# Patient Record
Sex: Female | Born: 2000 | Race: Black or African American | Hispanic: No | Marital: Single | Smoking: Never smoker
Health system: Southern US, Community
[De-identification: ages and names within clinical notes are randomized; demographics above are authoritative.]

## PROBLEM LIST (undated history)

## (undated) DIAGNOSIS — G43909 Migraine, unspecified, not intractable, without status migrainosus: Secondary | ICD-10-CM

## (undated) DIAGNOSIS — J302 Other seasonal allergic rhinitis: Secondary | ICD-10-CM

## (undated) HISTORY — PX: WISDOM TOOTH EXTRACTION: SHX21

---
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
## 2019-04-07 ENCOUNTER — Ambulatory Visit: Payer: Managed Care, Other (non HMO) | Admitting: Physical Therapy

## 2019-04-15 ENCOUNTER — Ambulatory Visit: Payer: Managed Care, Other (non HMO) | Attending: Gastroenterology | Admitting: Physical Therapy

## 2019-06-07 ENCOUNTER — Encounter: Payer: Self-pay | Admitting: Emergency Medicine

## 2019-06-07 ENCOUNTER — Emergency Department: Payer: Managed Care, Other (non HMO)

## 2019-06-07 ENCOUNTER — Other Ambulatory Visit: Payer: Self-pay

## 2019-06-07 ENCOUNTER — Emergency Department
Admission: EM | Admit: 2019-06-07 | Discharge: 2019-06-07 | Disposition: A | Discharge status: Home / Self Care | Payer: Managed Care, Other (non HMO) | Attending: Emergency Medicine | Admitting: Emergency Medicine

## 2019-06-07 DIAGNOSIS — M79671 Pain in right foot: Secondary | ICD-10-CM | POA: Diagnosis not present

## 2019-06-07 DIAGNOSIS — Z79899 Other long term (current) drug therapy: Secondary | ICD-10-CM | POA: Insufficient documentation

## 2019-06-07 MED ORDER — NAPROXEN 500 MG PO TABS: 500.0000 mg | ORAL_TABLET | Freq: Two times a day (BID) | ORAL | 0 refills | Status: DC

## 2019-06-07 NOTE — ED Notes (Signed)
Pt states she stepped on a piece of glass Friday night and didn't realize it til Saturday morning and states she got a large piece out but thinks there is still some in there.
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
## 2019-06-07 NOTE — Discharge Instructions (Signed)
Please follow up with primary care or the podiatrist for symptoms that are not improving over the next few days.  Return to the ER for symptoms that change or worsen if unable to schedule an appointment.

## 2019-06-07 NOTE — ED Provider Notes (Signed)
[facility] Emergency Department Provider Note  ____________________________________________  Time seen: Approximately 12:47 PM  I have reviewed the triage vital signs and the nursing notes.   HISTORY  Chief Complaint Foreign Body   HPI Meagan Daniel is a 18 y.o. female who presents to the emergency department for treatment and evaluation of right foot pain after stepping on glass Friday night. Saturday morning, she removed a large piece but believes there is still some in there. She rinsed with peroxide, but otherwise no alleviating measures prior to arrival.  History reviewed. No pertinent past medical history.  Patient Active Problem List   Diagnosis Date Noted  . Preseptal cellulitis of right lower eyelid 02/28/2016    History reviewed. No pertinent surgical history.  Prior to Admission medications   Medication Sig Start Date End Date Taking? Authorizing Provider  amoxicillin-clavulanate (AUGMENTIN) 875-125 MG tablet Take 1 tablet by mouth 2 (two) times daily. Patient not taking: Reported on 06/07/2019 02/29/16   Adrian SaranMody, Sital, MD  cetirizine (ZYRTEC) 10 MG tablet Take 10 mg by mouth daily.    [provider]  ibuprofen (ADVIL,MOTRIN) 600 MG tablet Take 1 tablet (600 mg total) by mouth every 6 (six) hours as needed. Patient not taking: Reported on 06/07/2019 08/26/18   Joy, Shawn C, PA-C  lidocaine (LIDODERM) 5 % Place 1 patch onto the skin daily. Remove & Discard patch within 12 hours or as directed by MD Patient not taking: Reported on 06/07/2019 08/26/18   Joy, Hillard DankerShawn C, PA-C  methocarbamol (ROBAXIN) 500 MG tablet Take 1 tablet (500 mg total) by mouth 2 (two) times daily. Patient not taking: Reported on 06/07/2019 08/26/18   Harolyn RutherfordJoy, Shawn C, PA-C  naproxen (NAPROSYN) 500 MG tablet Take 1 tablet (500 mg total) by mouth 2 (two) times daily with a meal. 06/07/19   Ilaisaane Marts B, FNP    Allergies Lactose intolerance (gi) and Diphenhydramine hcl  No  family history on file.  Social History Social History   Tobacco Use  . Smoking status: Never Smoker  . Smokeless tobacco: Never Used  Substance Use Topics  . Alcohol use: No    Alcohol/week: 0.0 standard drinks  . Drug use: No    Review of Systems  Constitutional: Negative for fever. Respiratory: Negative for cough or shortness of breath.  Musculoskeletal: Negative for myalgias Skin: Questionable for foreign body. Neurological: Negative for numbness or paresthesias. ____________________________________________   PHYSICAL EXAM:  VITAL SIGNS: ED Triage Vitals  Enc Vitals Group     BP 06/07/19 1134 117/78     Pulse Rate 06/07/19 1134 88     Resp 06/07/19 1134 16     Temp 06/07/19 1134 98.4 F (36.9 C)     Temp Source 06/07/19 1134 Oral     SpO2 06/07/19 1134 100 %     Weight 06/07/19 1132 248 lb (112.5 kg)     Height 06/07/19 1132 5\' 11"  (1.803 m)     Head Circumference --      Peak Flow --      Pain Score 06/07/19 1132 7     Pain Loc --      Pain Edu? --      Excl. in GC? --      Constitutional: Well appearing. Eyes: Conjunctivae are clear without discharge or drainage. Nose: No rhinorrhea noted. Mouth/Throat: Airway is patent.  Neck: No stridor. Unrestricted range of motion observed. Cardiovascular: Capillary refill is <3 seconds.  Respiratory: Respirations are even and unlabored.. Musculoskeletal:  Unrestricted range of motion observed. Neurologic: Awake, alert, and oriented x 4.  Skin: No open wounds over the right foot on exam.  ____________________________________________   LABS (all labs ordered are listed, but only abnormal results are displayed)  Labs Reviewed - No data to display ____________________________________________  EKG  Not indicated. ____________________________________________  RADIOLOGY  Image of the right foot is negative for acute abnormality.   ____________________________________________   PROCEDURES  Procedures ____________________________________________   INITIAL IMPRESSION / ASSESSMENT AND PLAN / ED COURSE  Meagan Daniel is a 19 y.o. female presenting to the ER for evaluation of right foot pain after stepping on Glass 2 days ago.   Ultrasound utilized by me to attempt to identify foreign body after the x-ray was negative. No obvious foreign body identified on my exam.   She will be given crutches and ACE applied prior to discharge. She will be given a prescription for naprosyn prn pain. She is to follow up with podiatry or primary care if not improving over the next few days. She is to return to the ER for symptoms that change or worsen if unable to schedule an appointment. Medications - No data to display   Pertinent labs & imaging results that were available during my care of the patient were reviewed by me and considered in my medical decision making (see chart for details).  ____________________________________________   FINAL CLINICAL IMPRESSION(S) / ED DIAGNOSES  Final diagnoses:  Foot pain, right    ED Discharge Orders         Ordered    naproxen (NAPROSYN) 500 MG tablet  2 times daily with meals     06/07/19 1328           Note:  This document was prepared using Dragon voice recognition software and may include unintentional dictation errors.   Victorino Dike, FNP 06/07/19 1333    Nena Polio, MD 06/07/19 2139

## 2019-06-29 ENCOUNTER — Ambulatory Visit: Payer: Managed Care, Other (non HMO) | Attending: Gastroenterology | Admitting: Physical Therapy

## 2019-08-09 ENCOUNTER — Encounter (HOSPITAL_COMMUNITY): Payer: Self-pay | Admitting: Emergency Medicine

## 2019-08-09 ENCOUNTER — Emergency Department (HOSPITAL_COMMUNITY)
Admission: EM | Admit: 2019-08-09 | Discharge: 2019-08-10 | Disposition: A | Discharge status: Home / Self Care | Payer: Managed Care, Other (non HMO) | Attending: Emergency Medicine | Admitting: Emergency Medicine

## 2019-08-09 ENCOUNTER — Other Ambulatory Visit: Payer: Self-pay

## 2019-08-09 DIAGNOSIS — R519 Headache, unspecified: Secondary | ICD-10-CM | POA: Insufficient documentation

## 2019-08-09 DIAGNOSIS — Z79899 Other long term (current) drug therapy: Secondary | ICD-10-CM | POA: Diagnosis not present

## 2019-08-09 HISTORY — DX: Migraine, unspecified, not intractable, without status migrainosus: G43.909

## 2019-08-09 NOTE — ED Triage Notes (Signed)
C/o headache x 3 days.  Seen at an [facility] yesterday and given Tylenol, Reglan, and Toradol.  Headache resolved and then came back today.  Fever 100 at home.

## 2019-08-10 ENCOUNTER — Emergency Department (HOSPITAL_COMMUNITY): Payer: Managed Care, Other (non HMO)

## 2019-08-10 DIAGNOSIS — R519 Headache, unspecified: Secondary | ICD-10-CM | POA: Diagnosis not present

## 2019-08-10 MED ORDER — KETOROLAC TROMETHAMINE 30 MG/ML IJ SOLN
30.0000 mg | Freq: Once | INTRAMUSCULAR | Status: AC
  Administered 2019-08-10: 30 mg via INTRAVENOUS
  Filled 2019-08-10: qty 1

## 2019-08-10 MED ORDER — METOCLOPRAMIDE HCL 5 MG/ML IJ SOLN
10.0000 mg | Freq: Once | INTRAMUSCULAR | Status: AC
  Administered 2019-08-10: 10 mg via INTRAVENOUS
  Filled 2019-08-10: qty 2

## 2019-08-10 MED ORDER — DIPHENHYDRAMINE HCL 50 MG/ML IJ SOLN
25.0000 mg | Freq: Once | INTRAMUSCULAR | Status: DC
  Filled 2019-08-10: qty 1

## 2019-08-10 MED ORDER — SODIUM CHLORIDE 0.9 % IV BOLUS
1000.0000 mL | Freq: Once | INTRAVENOUS | Status: AC
  Administered 2019-08-10: 02:00:00 1000 mL via INTRAVENOUS

## 2019-08-10 MED ORDER — DEXAMETHASONE SODIUM PHOSPHATE 10 MG/ML IJ SOLN
10.0000 mg | Freq: Once | INTRAMUSCULAR | Status: AC
  Administered 2019-08-10: 02:00:00 10 mg via INTRAVENOUS
  Filled 2019-08-10: qty 1

## 2019-08-10 NOTE — ED Notes (Signed)
Patient transported to CT 

## 2019-08-10 NOTE — Discharge Instructions (Addendum)
Continue medications as previously prescribed.  Follow-up with your primary doctor if symptoms or not improving in the next few days. 

## 2019-08-10 NOTE — ED Provider Notes (Signed)
[facility] Provider Note   CSN: [id number] Arrival date & time: 08/09/19  1930     History   Chief Complaint Chief Complaint  Patient presents with  . Headache    HPI Meagan Daniel is a 18 y.o. female.     Patient is an 18 year old female with history of migraines presenting with complaints of headache.  This has been present for the past several days.  She was seen at urgent care and given medications with some relief yesterday, however her headache has returned.  She describes a pressure to the front of her head with no associated visual disturbances, numbness, tingling, or vomiting.  She denies any injury or trauma.  The history is provided by the patient.  Headache Pain location:  Frontal Radiates to:  Does not radiate Duration:  3 days Timing:  Constant Progression:  Worsening Chronicity:  Recurrent Relieved by:  Nothing Worsened by:  Nothing Ineffective treatments:  Prescription medications   Past Medical History:  Diagnosis Date  . Migraine     Patient Active Problem List   Diagnosis Date Noted  . Preseptal cellulitis of right lower eyelid 02/28/2016    History reviewed. No pertinent surgical history.   OB History    Gravida  0   Para  0   Term  0   Preterm  0   AB  0   Living  0     SAB  0   TAB  0   Ectopic  0   Multiple  0   Live Births  0            Home Medications    Prior to Admission medications   Medication Sig Start Date End Date Taking? Authorizing Provider  nortriptyline (PAMELOR) 25 MG capsule Take 25 mg by mouth at bedtime.   Yes [provider]  amoxicillin-clavulanate (AUGMENTIN) 875-125 MG tablet Take 1 tablet by mouth 2 (two) times daily. Patient not taking: Reported on 06/07/2019 02/29/16   Bettey Costa, MD  ibuprofen (ADVIL,MOTRIN) 600 MG tablet Take 1 tablet (600 mg total) by mouth every 6 (six) hours as needed. Patient not taking: Reported on 06/07/2019 08/26/18    Joy, Shawn C, PA-C  lidocaine (LIDODERM) 5 % Place 1 patch onto the skin daily. Remove & Discard patch within 12 hours or as directed by MD Patient not taking: Reported on 06/07/2019 08/26/18   Joy, Helane Gunther, PA-C  methocarbamol (ROBAXIN) 500 MG tablet Take 1 tablet (500 mg total) by mouth 2 (two) times daily. Patient not taking: Reported on 06/07/2019 08/26/18   Arlean Hopping C, PA-C  naproxen (NAPROSYN) 500 MG tablet Take 1 tablet (500 mg total) by mouth 2 (two) times daily with a meal. Patient not taking: Reported on 08/10/2019 06/07/19   Victorino Dike, FNP    Family History No family history on file.  Social History Social History   Tobacco Use  . Smoking status: Never Smoker  . Smokeless tobacco: Never Used  Substance Use Topics  . Alcohol use: No    Alcohol/week: 0.0 standard drinks  . Drug use: No     Allergies   Lactose intolerance (gi) and Diphenhydramine hcl   Review of Systems Review of Systems  Neurological: Positive for headaches.  All other systems reviewed and are negative.    Physical Exam Updated Vital Signs BP 110/80 (BP Location: Left Arm)   Pulse 82   Temp 98.5 F (36.9 C) (Oral)  Resp 19   LMP 07/26/2019   SpO2 100%   Physical Exam Vitals signs and nursing note reviewed.  Constitutional:      General: She is not in acute distress.    Appearance: She is well-developed. She is not diaphoretic.  HENT:     Head: Normocephalic and atraumatic.  Eyes:     General: No visual field deficit. Neck:     Musculoskeletal: Normal range of motion and neck supple.  Cardiovascular:     Rate and Rhythm: Normal rate and regular rhythm.     Heart sounds: No murmur. No friction rub. No gallop.   Pulmonary:     Effort: Pulmonary effort is normal. No respiratory distress.     Breath sounds: Normal breath sounds. No wheezing.  Abdominal:     General: Bowel sounds are normal. There is no distension.     Palpations: Abdomen is soft.     Tenderness: There is no  abdominal tenderness.  Musculoskeletal: Normal range of motion.  Skin:    General: Skin is warm and dry.  Neurological:     Mental Status: She is alert and oriented to person, place, and time.     Cranial Nerves: No cranial nerve deficit, dysarthria or facial asymmetry.      ED Treatments / Results  Labs (all labs ordered are listed, but only abnormal results are displayed) Labs Reviewed - No data to display  EKG None  Radiology Ct Head Wo Contrast  Result Date: 08/10/2019 CLINICAL DATA:  Headache for 3 days EXAM: CT HEAD WITHOUT CONTRAST TECHNIQUE: Contiguous axial images were obtained from the base of the skull through the vertex without intravenous contrast. COMPARISON:  None. FINDINGS: Brain: No evidence of acute infarction, hemorrhage, hydrocephalus, extra-axial collection or mass lesion/mass effect. Vascular: No hyperdense vessel or unexpected calcification. Skull: Normal. Negative for fracture or focal lesion. Sinuses/Orbits: Mucous retention cysts in the maxillary sinuses Other: None IMPRESSION: Negative non contrasted CT appearance of the brain Electronically Signed   By: Jasmine Pang M.D.   On: 08/10/2019 01:56    Procedures Procedures (including critical care time)  Medications Ordered in ED Medications  diphenhydrAMINE (BENADRYL) injection 25 mg (25 mg Intravenous Not Given 08/10/19 0217)  sodium chloride 0.9 % bolus 1,000 mL (0 mLs Intravenous Stopped 08/10/19 0302)  ketorolac (TORADOL) 30 MG/ML injection 30 mg (30 mg Intravenous Given 08/10/19 0212)  metoCLOPramide (REGLAN) injection 10 mg (10 mg Intravenous Given 08/10/19 0212)  dexamethasone (DECADRON) injection 10 mg (10 mg Intravenous Given 08/10/19 0212)     Initial Impression / Assessment and Plan / ED Course  I have reviewed the triage vital signs and the nursing notes.  Pertinent labs & imaging results that were available during my care of the patient were reviewed by me and considered in my medical decision  making (see chart for details).  Patient's headache has resolved after migraine cocktail.  She is neurologically intact and head CT is negative.  Patient will be discharged with follow-up with PCP.  Final Clinical Impressions(s) / ED Diagnoses   Final diagnoses:  None    ED Discharge Orders    None       Geoffery Lyons, MD 08/10/19 [phone]

## 2020-05-16 ENCOUNTER — Encounter: Payer: Self-pay | Admitting: Emergency Medicine

## 2020-05-16 ENCOUNTER — Other Ambulatory Visit: Payer: Self-pay

## 2020-05-16 ENCOUNTER — Ambulatory Visit
Admission: EM | Admit: 2020-05-16 | Discharge: 2020-05-16 | Disposition: A | Discharge status: Home / Self Care | Payer: Medicaid Other | Attending: Emergency Medicine | Admitting: Emergency Medicine

## 2020-05-16 DIAGNOSIS — K219 Gastro-esophageal reflux disease without esophagitis: Secondary | ICD-10-CM | POA: Diagnosis not present

## 2020-05-16 MED ORDER — PANTOPRAZOLE SODIUM 40 MG PO TBEC: 40.0000 mg | DELAYED_RELEASE_TABLET | Freq: Every day | ORAL | 0 refills | Status: DC

## 2020-05-16 NOTE — ED Provider Notes (Signed)
[facility]    CSN: [id number] Arrival date & time: 05/16/20  1934      History   Chief Complaint Chief Complaint  Patient presents with  . Dental Pain    HPI Meagan Daniel is a 19 y.o. female with history of migraines, GERD presenting for GERD flare.  States that she has had increased belching, reflux over the last 3 days.  Not currently taking any medications despite being recommended by her GI doctor to do so.  States her grandmother told her to "drink a Pepsi really fast "to burp which she thought would alleviate her retrosternal chest pain.  Has tried Alka-Seltzer without relief.  No nausea, emesis.  Woke up with her tongue feeling sore.  Denies dental pain, difficulty breathing or swallowing.   Past Medical History:  Diagnosis Date  . Migraine     Patient Active Problem List   Diagnosis Date Noted  . Preseptal cellulitis of right lower eyelid 02/28/2016    History reviewed. No pertinent surgical history.  OB History    Gravida  0   Para  0   Term  0   Preterm  0   AB  0   Living  0     SAB  0   TAB  0   Ectopic  0   Multiple  0   Live Births  0            Home Medications    Prior to Admission medications   Medication Sig Start Date End Date Taking? Authorizing Provider  pantoprazole (PROTONIX) 40 MG tablet Take 1 tablet (40 mg total) by mouth daily. 05/16/20 06/15/20  Hall-Potvin, [place], PA-C  nortriptyline (PAMELOR) 25 MG capsule Take 25 mg by mouth at bedtime.  05/16/20  [provider]    Family History Family History  Problem Relation Age of Onset  . Healthy Mother   . Healthy Father     Social History Social History   Tobacco Use  . Smoking status: Never Smoker  . Smokeless tobacco: Never Used  Substance Use Topics  . Alcohol use: No    Alcohol/week: 0.0 standard drinks  . Drug use: No     Allergies   Lactose intolerance (gi) and Diphenhydramine hcl   Review of Systems As per  HPI   Physical Exam Triage Vital Signs ED Triage Vitals  Enc Vitals Group     BP 05/16/20 1943 114/78     Pulse Rate 05/16/20 1943 80     Resp 05/16/20 1943 18     Temp 05/16/20 1943 97.8 F (36.6 C)     Temp Source 05/16/20 1943 Temporal     SpO2 05/16/20 1943 98 %     Weight --      Height --      Head Circumference --      Peak Flow --      Pain Score 05/16/20 1942 9     Pain Loc --      Pain Edu? --      Excl. in GC? --    No data found.  Updated Vital Signs BP 114/78 (BP Location: Left Arm)   Pulse 80   Temp 97.8 F (36.6 C) (Temporal)   Resp 18   LMP 05/02/2020   SpO2 98%   Visual Acuity Right Eye Distance:   Left Eye Distance:   Bilateral Distance:    Right Eye Near:   Left Eye Near:    Bilateral  Near:     Physical Exam Constitutional:      General: She is not in acute distress. HENT:     Head: Normocephalic and atraumatic.     Mouth/Throat:     Mouth: Mucous membranes are moist.     Pharynx: Oropharynx is clear. No oropharyngeal exudate or posterior oropharyngeal erythema.  Eyes:     General: No scleral icterus.    Pupils: Pupils are equal, round, and reactive to light.  Cardiovascular:     Rate and Rhythm: Normal rate.  Pulmonary:     Effort: Pulmonary effort is normal. No respiratory distress.     Breath sounds: No wheezing.  Abdominal:     General: Bowel sounds are normal.     Palpations: Abdomen is soft.     Tenderness: There is no abdominal tenderness.  Skin:    Coloration: Skin is not jaundiced or pale.  Neurological:     Mental Status: She is alert and oriented to person, place, and time.      UC Treatments / Results  Labs (all labs ordered are listed, but only abnormal results are displayed) Labs Reviewed - No data to display  EKG   Radiology No results found.  Procedures Procedures (including critical care time)  Medications Ordered in UC Medications - No data to display  Initial Impression / Assessment and Plan  / UC Course  I have reviewed the triage vital signs and the nursing notes.  Pertinent labs & imaging results that were available during my care of the patient were reviewed by me and considered in my medical decision making (see chart for details).     Patient febrile, nontoxic in office today.  Exam benign currently.  H&P consistent with GERD: We will start Protonix, follow-up with GI doctor.  Return precautions discussed, patient verbalized understanding and is agreeable to plan. Final Clinical Impressions(s) / UC Diagnoses   Final diagnoses:  Gastroesophageal reflux disease, unspecified whether esophagitis present     Discharge Instructions     Take Protonix once daily. Please take time to review food suggestion packet that is attached. Important to follow-up with your PCP and/or gastroenterology for further evaluation/management of your reflux. Go to ER for worsening chest pain redevelop palpitations, fever, lightheadedness, weakness, shortness of breath.    ED Prescriptions    Medication Sig Dispense Auth. Provider   pantoprazole (PROTONIX) 40 MG tablet Take 1 tablet (40 mg total) by mouth daily. 30 tablet Hall-Potvin, [place], PA-C     PDMP not reviewed this encounter.   Odette Fraction [place], [place] 05/16/20 1952

## 2020-05-16 NOTE — ED Notes (Signed)
Patient able to ambulate independently  

## 2020-05-16 NOTE — Discharge Instructions (Signed)
Take Protonix once daily. Please take time to review food suggestion packet that is attached. Important to follow-up with your PCP and/or gastroenterology for further evaluation/management of your reflux. Go to ER for worsening chest pain redevelop palpitations, fever, lightheadedness, weakness, shortness of breath.

## 2020-05-16 NOTE — ED Triage Notes (Signed)
atient presents to [facility] for assessment of GERD x 3 days, trying "drinking a pepsi really fast", alka seltzer without relief.  States she woke up today and the middle and back of her tongue was sore.

## 2020-05-18 ENCOUNTER — Ambulatory Visit
Admission: EM | Admit: 2020-05-18 | Discharge: 2020-05-18 | Disposition: A | Discharge status: Home / Self Care | Payer: Medicaid Other | Attending: Physician Assistant | Admitting: Physician Assistant

## 2020-05-18 DIAGNOSIS — K146 Glossodynia: Secondary | ICD-10-CM

## 2020-05-18 MED ORDER — LIDOCAINE VISCOUS HCL 2 % MT SOLN: 15.0000 mL | OROMUCOSAL | 0 refills | Status: DC | PRN

## 2020-05-18 MED ORDER — CHLORHEXIDINE GLUCONATE 0.12 % MT SOLN: 15.0000 mL | Freq: Two times a day (BID) | OROMUCOSAL | 0 refills | Status: DC

## 2020-05-18 NOTE — Discharge Instructions (Signed)
Continue acid reflux medicine. Start peridex as directed. Lidocaine for pain. Follow up with ENT/dentist if symptoms not improving.

## 2020-05-18 NOTE — ED Triage Notes (Signed)
Pt states seen here on Monday for soreness to her tongue, states getting worse. States now sensitive and painful.

## 2020-05-18 NOTE — ED Provider Notes (Signed)
[facility]    CSN: [id number] Arrival date & time: 05/18/20  1321      History   Chief Complaint Chief Complaint  Patient presents with  . tongue pain    HPI Meagan Daniel is a 19 y.o. female.   19 year old female comes in for tongue pain. Denies injury/trauma. Was here 2 days ago with same symptoms and GERD. Has not started medicines prescribed because she takes other PPIs already. States tongue is sore and sensitive. Denies oral swelling, fever.      Past Medical History:  Diagnosis Date  . Migraine     Patient Active Problem List   Diagnosis Date Noted  . Preseptal cellulitis of right lower eyelid 02/28/2016    History reviewed. No pertinent surgical history.  OB History    Gravida  0   Para  0   Term  0   Preterm  0   AB  0   Living  0     SAB  0   TAB  0   Ectopic  0   Multiple  0   Live Births  0            Home Medications    Prior to Admission medications   Medication Sig Start Date End Date Taking? Authorizing Provider  chlorhexidine (PERIDEX) 0.12 % solution Use as directed 15 mLs in the mouth or throat 2 (two) times daily. 05/18/20   Cathie Hoops, Goerge Mohr V, PA-C  lidocaine (XYLOCAINE) 2 % solution Use as directed 15 mLs in the mouth or throat as needed for mouth pain. 05/18/20   Cathie Hoops, Valjean Ruppel V, PA-C  pantoprazole (PROTONIX) 40 MG tablet Take 1 tablet (40 mg total) by mouth daily. 05/16/20 06/15/20  Hall-Potvin, [place], PA-C  nortriptyline (PAMELOR) 25 MG capsule Take 25 mg by mouth at bedtime.  05/16/20  [provider]    Family History Family History  Problem Relation Age of Onset  . Healthy Mother   . Healthy Father     Social History Social History   Tobacco Use  . Smoking status: Never Smoker  . Smokeless tobacco: Never Used  Substance Use Topics  . Alcohol use: No    Alcohol/week: 0.0 standard drinks  . Drug use: No     Allergies   Lactose intolerance (gi) and Diphenhydramine hcl   Review of  Systems Review of Systems  Reason unable to perform ROS: See HPI as above.     Physical Exam Triage Vital Signs ED Triage Vitals [05/18/20 1334]  Enc Vitals Group     BP 125/82     Pulse Rate 78     Resp 18     Temp 98.4 F (36.9 C)     Temp Source Oral     SpO2 98 %     Weight      Height      Head Circumference      Peak Flow      Pain Score 9     Pain Loc      Pain Edu?      Excl. in GC?    No data found.  Updated Vital Signs BP 125/82 (BP Location: Left Arm)   Pulse 78   Temp 98.4 F (36.9 C) (Oral)   Resp 18   LMP 04/27/2020   SpO2 98%   Physical Exam Constitutional:      General: She is not in acute distress.    Appearance: Normal appearance. She  is well-developed. She is not toxic-appearing or diaphoretic.  HENT:     Head: Normocephalic and atraumatic.     Mouth/Throat:     Mouth: Mucous membranes are moist.     Pharynx: Oropharynx is clear. Uvula midline.     Comments: Thick yellow coating to the tongue that is painful. Plaques to the teeth/gumline. No gum swelling, erythema.  Eyes:     Conjunctiva/sclera: Conjunctivae normal.     Pupils: Pupils are equal, round, and reactive to light.  Pulmonary:     Effort: Pulmonary effort is normal. No respiratory distress.     Comments: Speaking in full sentences without difficulty Musculoskeletal:     Cervical back: Normal range of motion and neck supple.  Skin:    General: Skin is warm and dry.  Neurological:     Mental Status: She is alert and oriented to person, place, and time.    UC Treatments / Results  Labs (all labs ordered are listed, but only abnormal results are displayed) Labs Reviewed - No data to display  EKG   Radiology No results found.  Procedures Procedures (including critical care time)  Medications Ordered in UC Medications - No data to display  Initial Impression / Assessment and Plan / UC Course  I have reviewed the triage vital signs and the nursing notes.  Pertinent  labs & imaging results that were available during my care of the patient were reviewed by me and considered in my medical decision making (see chart for details).    Will provide Peridex as directed.  Lidocaine solution for symptomatic management.  Continue medication for GERD.  Follow-up with ENT/dentist if symptoms not improving.  Final Clinical Impressions(s) / UC Diagnoses   Final diagnoses:  Tongue pain   ED Prescriptions    Medication Sig Dispense Auth. Provider   lidocaine (XYLOCAINE) 2 % solution Use as directed 15 mLs in the mouth or throat as needed for mouth pain. 100 mL Liston Thum V, PA-C   chlorhexidine (PERIDEX) 0.12 % solution Use as directed 15 mLs in the mouth or throat 2 (two) times daily. 120 mL Belinda Fisher, PA-C     PDMP not reviewed this encounter.   Belinda Fisher, PA-C 05/18/20 1705

## 2020-06-13 ENCOUNTER — Ambulatory Visit
Admission: EM | Admit: 2020-06-13 | Discharge: 2020-06-13 | Disposition: A | Discharge status: Home / Self Care | Payer: Medicaid Other

## 2020-06-13 ENCOUNTER — Other Ambulatory Visit: Payer: Self-pay

## 2020-06-13 DIAGNOSIS — S61213A Laceration without foreign body of left middle finger without damage to nail, initial encounter: Secondary | ICD-10-CM | POA: Diagnosis not present

## 2020-06-13 NOTE — ED Provider Notes (Signed)
[facility]    CSN: [id number] Arrival date & time: 06/13/20  1837      History   Chief Complaint Chief Complaint  Patient presents with  . Laceration    HPI Meagan Daniel is a 19 y.o. female.  For left middle finger laceration.  States she dropped a group of glass around 10:30 PM.  Tetanus up-to-date.  Bleeding controlled PTA with direct pressure.  Denies anticoagulant use.  No numbness or deformity.  No foreign body.    Past Medical History:  Diagnosis Date  . Migraine     Patient Active Problem List   Diagnosis Date Noted  . Preseptal cellulitis of right lower eyelid 02/28/2016    History reviewed. No pertinent surgical history.  OB History    Gravida  0   Para  0   Term  0   Preterm  0   AB  0   Living  0     SAB  0   TAB  0   Ectopic  0   Multiple  0   Live Births  0            Home Medications    Prior to Admission medications   Medication Sig Start Date End Date Taking? Authorizing Provider  cetirizine (ZYRTEC) 10 MG tablet Take 10 mg by mouth daily.   Yes [provider]  chlorhexidine (PERIDEX) 0.12 % solution Use as directed 15 mLs in the mouth or throat 2 (two) times daily. 05/18/20   Cathie Hoops, Amy V, PA-C  nortriptyline (PAMELOR) 25 MG capsule Take 25 mg by mouth at bedtime.  05/16/20  [provider]    Family History Family History  Problem Relation Age of Onset  . Healthy Mother   . Healthy Father     Social History Social History   Tobacco Use  . Smoking status: Never Smoker  . Smokeless tobacco: Never Used  Substance Use Topics  . Alcohol use: No    Alcohol/week: 0.0 standard drinks  . Drug use: No     Allergies   Lactose intolerance (gi) and Diphenhydramine hcl   Review of Systems As per HPI   Physical Exam Triage Vital Signs ED Triage Vitals  Enc Vitals Group     BP      Pulse      Resp      Temp      Temp src      SpO2      Weight      Height      Head  Circumference      Peak Flow      Pain Score      Pain Loc      Pain Edu?      Excl. in GC?    No data found.  Updated Vital Signs BP 123/79 (BP Location: Left Arm)   Pulse 82   Temp 98.2 F (36.8 C) (Oral)   Resp 18   LMP 05/30/2020   SpO2 98%   Visual Acuity Right Eye Distance:   Left Eye Distance:   Bilateral Distance:    Right Eye Near:   Left Eye Near:    Bilateral Near:     Physical Exam Constitutional:      General: She is not in acute distress. HENT:     Head: Normocephalic and atraumatic.  Eyes:     General: No scleral icterus.    Pupils: Pupils are equal, round, and reactive to  light.  Cardiovascular:     Rate and Rhythm: Normal rate.  Pulmonary:     Effort: Pulmonary effort is normal.  Musculoskeletal:        General: No swelling or tenderness. Normal range of motion.  Skin:    Coloration: Skin is not jaundiced or pale.     Comments: 1 cm superficial laceration noted to medial aspect of left third digit.  No FB.  Neurological:     Mental Status: She is alert and oriented to person, place, and time.      UC Treatments / Results  Labs (all labs ordered are listed, but only abnormal results are displayed) Labs Reviewed - No data to display  EKG   Radiology No results found.  Procedures Laceration Repair  Date/Time: 06/13/2020 7:31 PM Performed by: Shea Evans, PA-C Authorized by: Shea Evans, PA-C   Consent:    Consent obtained:  Verbal   Consent given by:  Patient   Risks discussed:  Infection, need for additional repair, pain, poor cosmetic result and poor wound healing   Alternatives discussed:  No treatment and delayed treatment Universal protocol:    Patient identity confirmed:  Verbally with patient Anesthesia (see MAR for exact dosages):    Anesthesia method:  None Laceration details:    Location:  Finger   Finger location:  L long finger   Length (cm):  1   Depth (mm):  1 Repair type:    Repair type:   Simple Pre-procedure details:    Preparation:  Patient was prepped and draped in usual sterile fashion Exploration:    Hemostasis achieved with:  Direct pressure   Wound exploration: wound explored through full range of motion     Contaminated: no   Treatment:    Area cleansed with:  Soap and water   Amount of cleaning:  Standard   Irrigation solution:  Sterile saline   Irrigation method:  Pressure wash Skin repair:    Repair method:  Steri-Strips   Number of Steri-Strips:  1 Approximation:    Approximation:  Close Post-procedure details:    Dressing:  Adhesive bandage   Patient tolerance of procedure:  Tolerated well, no immediate complications   (including critical care time)  Medications Ordered in UC Medications - No data to display  Initial Impression / Assessment and Plan / UC Course  I have reviewed the triage vital signs and the nursing notes.  Pertinent labs & imaging results that were available during my care of the patient were reviewed by me and considered in my medical decision making (see chart for details).     Steri-Strip applied at patient's request: Tolerated this well.  Return precautions discussed, pt verbalized understanding and is agreeable to plan. Final Clinical Impressions(s) / UC Diagnoses   Final diagnoses:  Laceration of left middle finger without foreign body without damage to nail, initial encounter     Discharge Instructions     Keep skin clean - may use gentle soaps without perfumes/dyes. Avoid hot water (showers, baths) as this can further dry out and irritate skin. Pat skin dry as rubbing can irritate and tear skin.    ED Prescriptions    None     PDMP not reviewed this encounter.   Odette Fraction [place], [place] 06/13/20 1932

## 2020-06-13 NOTE — Discharge Instructions (Addendum)
Keep skin clean - may use gentle soaps without perfumes/dyes. Avoid hot water (showers, baths) as this can further dry out and irritate skin. Pat skin dry as rubbing can irritate and tear skin. 

## 2020-06-13 NOTE — ED Triage Notes (Signed)
Pt states dropped and broke a glass last night around 1030pm and cut herself on lt middle finger and lt ankle. Bleeding controlled, bandage applied.

## 2021-04-27 ENCOUNTER — Other Ambulatory Visit: Payer: Medicaid Other

## 2021-05-31 IMAGING — CT CT HEAD W/O CM
4 series · 16 of 47 positions shown, 18 images · non-contrast
Comparison: None.

CLINICAL DATA: Headache for 3 days

EXAM:
CT HEAD WITHOUT CONTRAST
TECHNIQUE: Contiguous axial images were obtained from the base of the skull
through the vertex without intravenous contrast.

[Series 3: head without · axial · non-contrast · 0.44mm/px · z∈[-119,+1]mm · 7 of 33 slices shown, 9 images]
[im 5/33  brain]
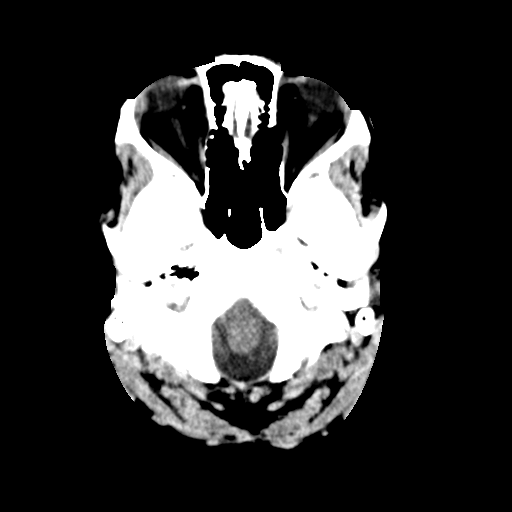
[im 5/33  bone]
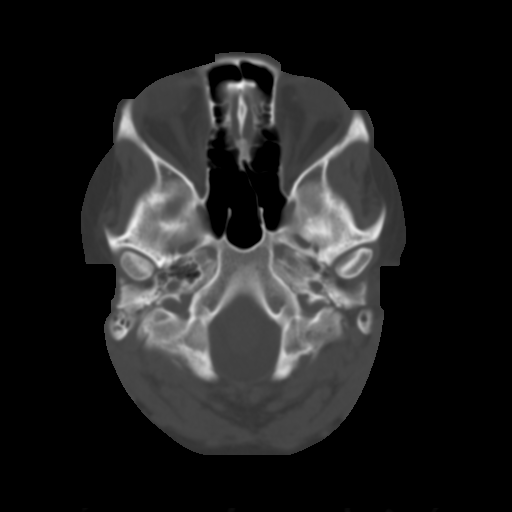
[im 9/33  brain]
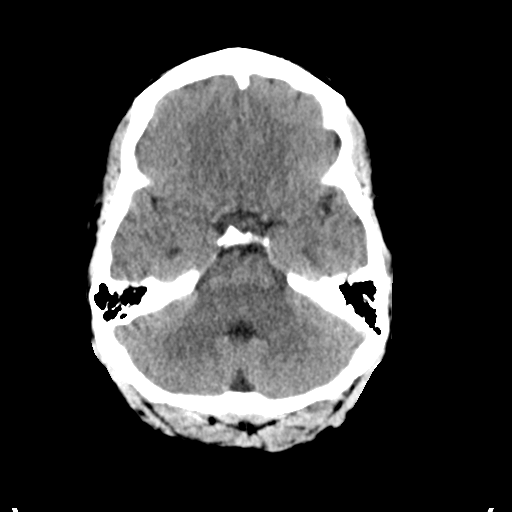
[im 13/33  brain]
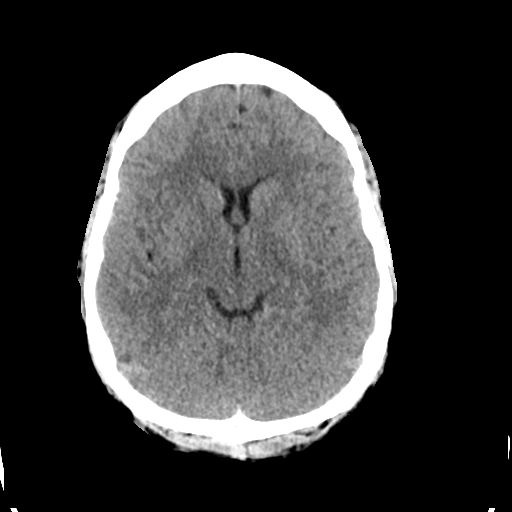
[im 17/33  brain]
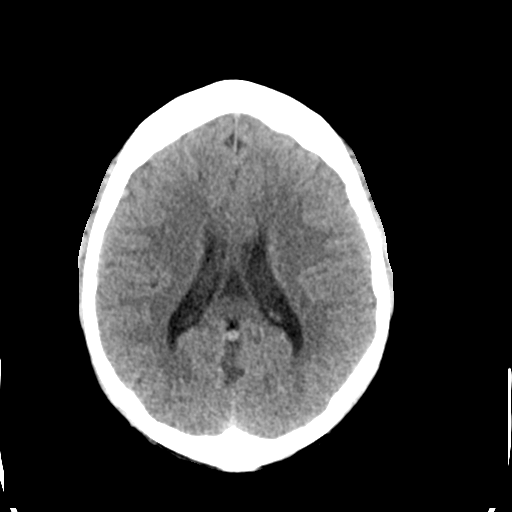
[im 21/33  brain]
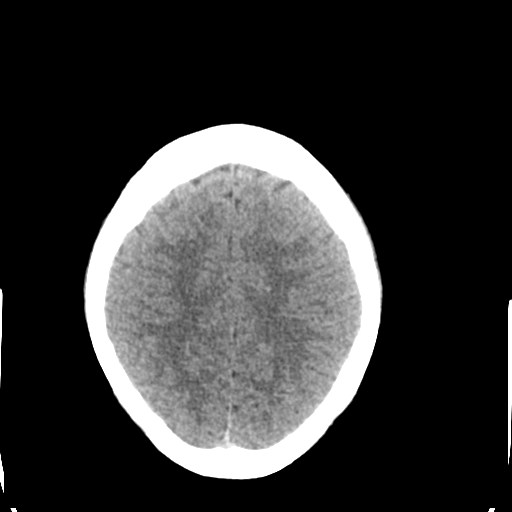
[im 21/33  bone]
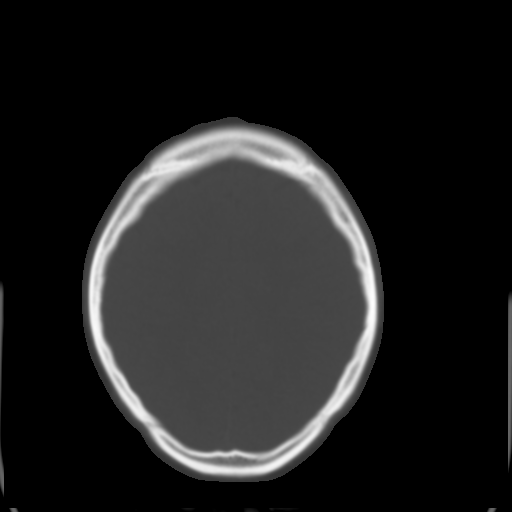
[im 25/33  brain]
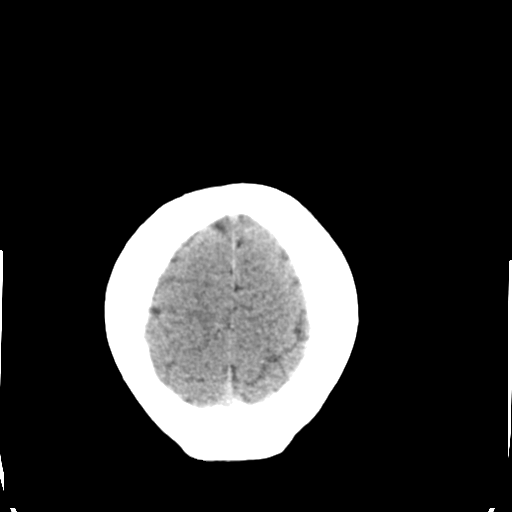
[im 29/33  brain]
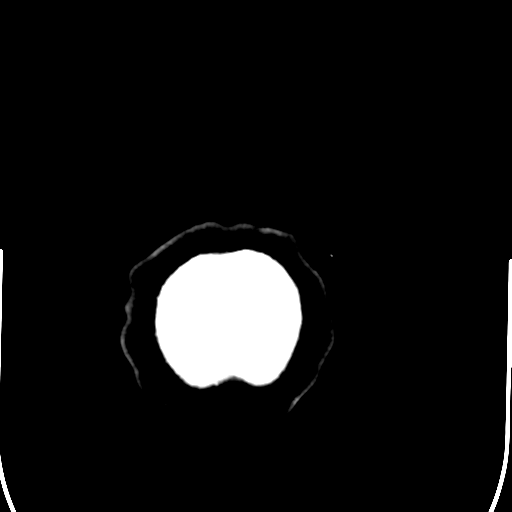

[Series 4: head bone · axial · 0.44mm/px · z∈[-123,-91]mm · 3 of 81 slices shown]
[im 9/81  bone]
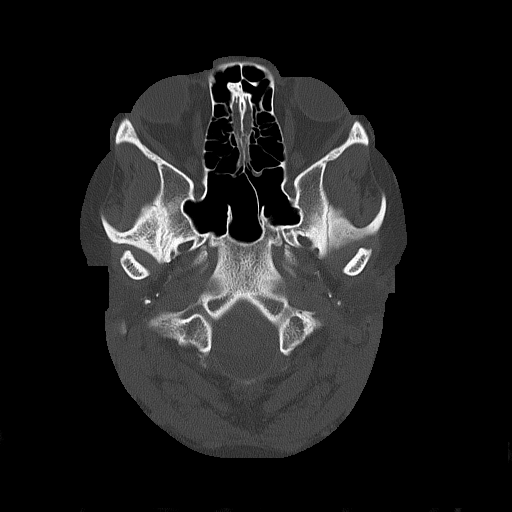
[im 17/81  bone]
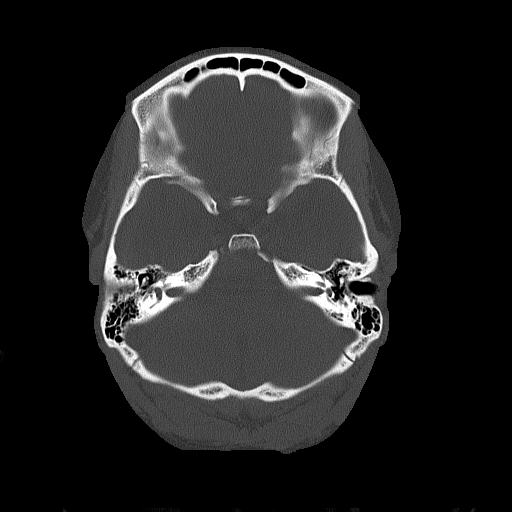
[im 25/81  bone]
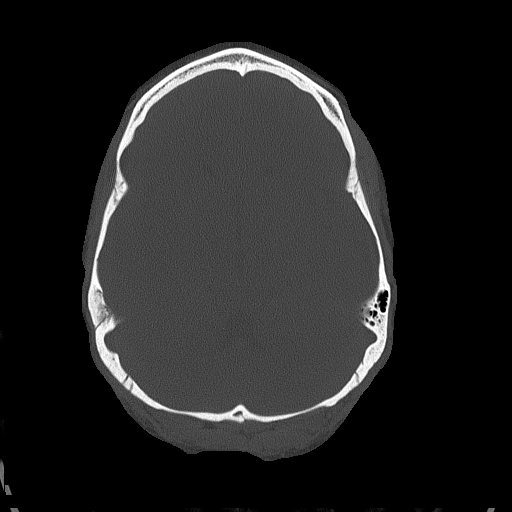

[Series 5: head without cor · coronal · non-contrast · 0.32mm/px · 3 of 71 slices shown]
[im 24/71  brain]
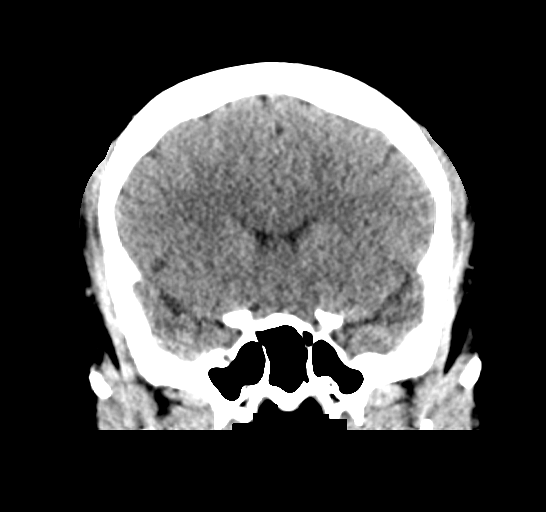
[im 32/71  brain]
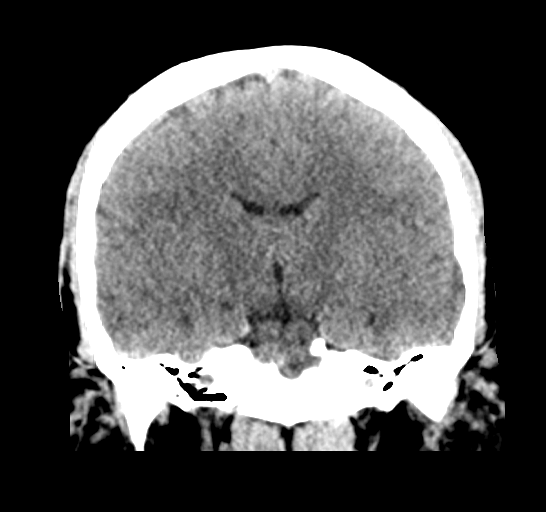
[im 39/71  brain]
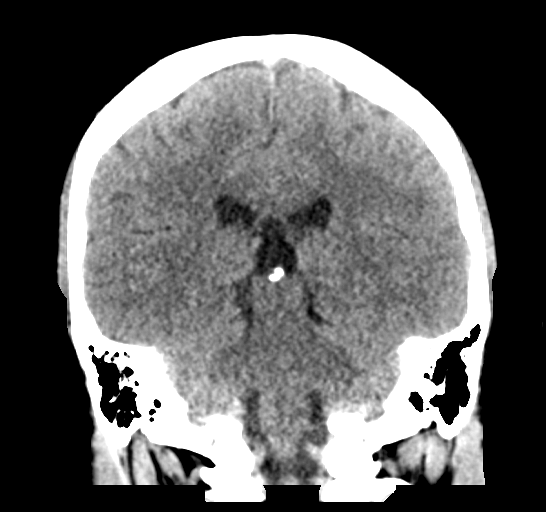

[Series 6: head without sag · sagittal · non-contrast · 0.32mm/px · 3 of 59 slices shown]
[im 20/59  brain]
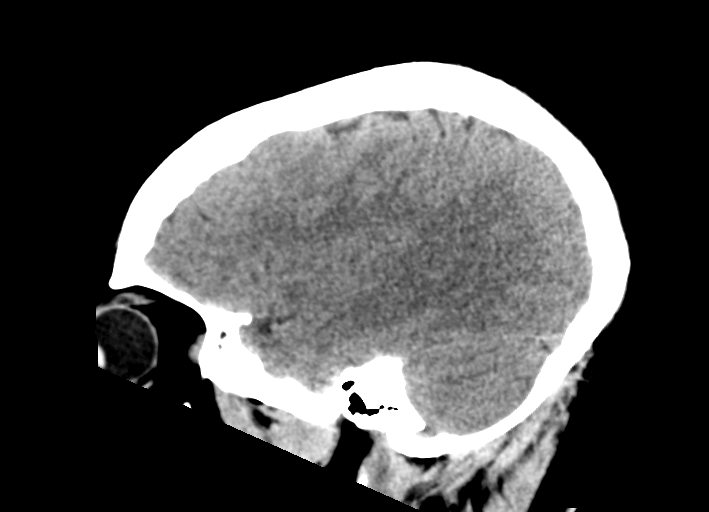
[im 30/59  brain]
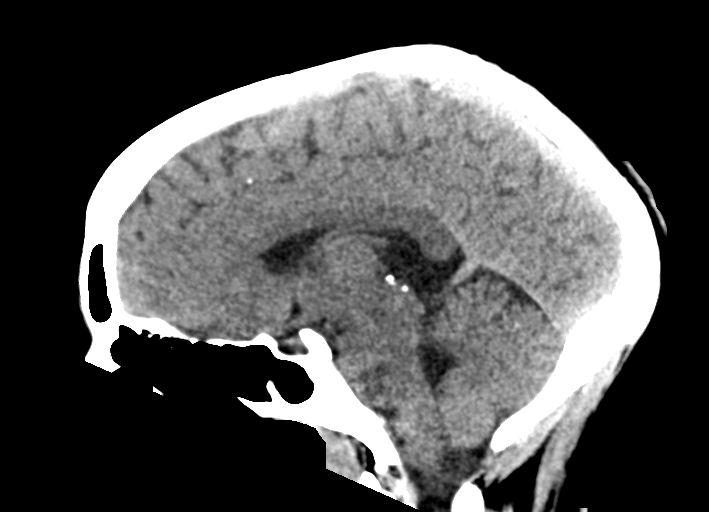
[im 39/59  brain]
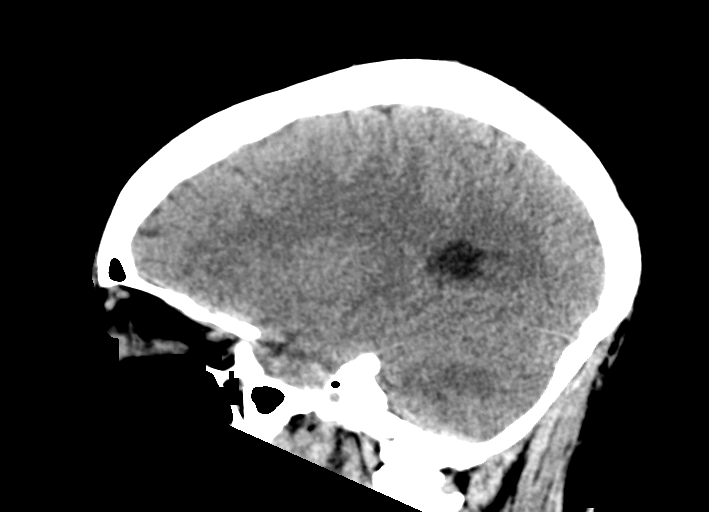

[16 of 47 positions shown; findings below may reference images not displayed]

FINDINGS: Brain: No evidence of acute infarction, hemorrhage, hydrocephalus,
extra-axial collection or mass lesion/mass effect.

Vascular: No hyperdense vessel or unexpected calcification.

Skull: Normal. Negative for fracture or focal lesion.

Sinuses/Orbits: Mucous retention cysts in the maxillary sinuses

Other: None
IMPRESSION: Negative non contrasted CT appearance of the brain

## 2021-07-09 ENCOUNTER — Ambulatory Visit
Admission: EM | Admit: 2021-07-09 | Discharge: 2021-07-09 | Disposition: A | Discharge status: Home / Self Care | Payer: Medicaid Other

## 2021-07-09 ENCOUNTER — Other Ambulatory Visit: Payer: Self-pay

## 2021-07-09 DIAGNOSIS — R591 Generalized enlarged lymph nodes: Secondary | ICD-10-CM | POA: Diagnosis not present

## 2021-07-09 HISTORY — DX: Other seasonal allergic rhinitis: J30.2

## 2021-07-09 NOTE — ED Provider Notes (Signed)
CHIEF COMPLAINT:   Chief Complaint  Patient presents with   Facial Pain     SUBJECTIVE/HPI:  HPI A very pleasant 20 y.o.Female presents today with right-sided jaw pain that radiates to the lower neck area that started yesterday.  No medications used.  No recent dental injuries. Patient does not report any shortness of breath, chest pain, palpitations, visual changes, weakness, tingling, headache, nausea, vomiting, diarrhea, fever, chills.   has a past medical history of Migraine and Seasonal allergies. ROS:  Review of Systems See Subjective/HPI Medications, Allergies and Problem List personally reviewed in Epic today OBJECTIVE:   Vitals:   07/09/21 1423  BP: 112/75  Pulse: 64  Resp: 16  Temp: 97.8 F (36.6 C)  SpO2: 98%    Physical Exam   General: Appears well-developed and well-nourished. No acute distress.  HEENT Head: Normocephalic and atraumatic.  Ears: Hearing grossly intact, no drainage or visible deformity.  Nose: No nasal deviation or rhinorrhea.  Mouth/Throat: No stridor or tracheal deviation.  No abscesses noted to gumline.  No cracked teeth noted. Neck: Full ROM.  Neck supple.  Right anterior cervical lymph node palpated, soft, tender and mobile. Eyes: Conjunctivae and EOM are normal. No eye drainage or scleral icterus bilaterally.  Cardiovascular: Normal rate  Pulm/Chest: No respiratory distress. Neurological: Alert and oriented to person, place, and time.  Skin: Skin is warm and dry.  Psychiatric: Normal mood, affect, behavior, and thought content.   Vital signs and nursing note reviewed.   Patient stable and cooperative with examination.  LABS/X-RAYS/EKG/MEDS:   No results found for any visits on 07/09/21.  MEDICAL DECISION MAKING:   Patient presents with right-sided jaw pain that radiates to the lower neck area that started yesterday.  No medications used.  No recent dental injuries. Patient does not report any shortness of breath, chest pain,  palpitations, visual changes, weakness, tingling, headache, nausea, vomiting, diarrhea, fever, chills.  Chart review completed.  Given symptoms along with assessment findings, likely right-sided lymphadenopathy.  Advised warm compresses to the area.  No concern for underlying bacterial etiology at this time which would require antibiotics.  Return for any fever, chills, sore throat or worsening symptoms.  Patient verbalized understanding and agreed with plan.  Patient stable upon discharge.  Return as needed.  ASSESSMENT/PLAN:  1. Lymphadenopathy    Plan:   Discharge Instructions      Warm compresses to the right side of your neck.  Return with any fever, chills, sore throat or worsening symptoms.         Amalia Greenhouse, FNP 07/09/21 1439

## 2021-07-09 NOTE — ED Triage Notes (Signed)
Patient presents to Urgent Care with complaints of right sided jaw pain that radiates to lower neck area since yesterday. Not taking any meds.   Denies fever.

## 2021-07-09 NOTE — Discharge Instructions (Signed)
Warm compresses to the right side of your neck.  Return with any fever, chills, sore throat or worsening symptoms.

## 2021-07-10 ENCOUNTER — Ambulatory Visit: Payer: Self-pay

## 2021-07-14 ENCOUNTER — Ambulatory Visit
Admission: EM | Admit: 2021-07-14 | Discharge: 2021-07-14 | Disposition: A | Discharge status: Home / Self Care | Payer: Medicaid Other

## 2021-07-14 DIAGNOSIS — R590 Localized enlarged lymph nodes: Secondary | ICD-10-CM

## 2021-07-14 NOTE — Discharge Instructions (Addendum)
Take Tylenol or ibuprofen as needed for discomfort.  Apply warm compresses as directed.  Follow up with your primary care provider if your symptoms are not improving.

## 2021-07-14 NOTE — ED Provider Notes (Signed)
[facility]    CSN: [id number] Arrival date & time: 07/14/21  7322      History   Chief Complaint Chief Complaint  Patient presents with   Jaw Pain     HPI Meagan Daniel is a 20 y.o. female.  Patient presents with 1 day history of left "jaw pain."  No falls or injury.  No dental pain or injury.  She denies rash, redness, bruising, lesions, fever, chills, ear pain, congestion, sore throat, cough, shortness of breath, or other symptoms.  Treatment attempted at home with 1 dose of Aleve taken earlier today.  Patient was seen at this urgent care for right side jaw pain on 07/09/2021; diagnosed with lymphadenopathy; treated symptomatically.    The history is provided by the patient and medical records.   Past Medical History:  Diagnosis Date   Migraine    Seasonal allergies     Patient Active Problem List   Diagnosis Date Noted   Preseptal cellulitis of right lower eyelid 02/28/2016    Past Surgical History:  Procedure Laterality Date   WISDOM TOOTH EXTRACTION      OB History     Gravida  0   Para  0   Term  0   Preterm  0   AB  0   Living  0      SAB  0   IAB  0   Ectopic  0   Multiple  0   Live Births  0            Home Medications    Prior to Admission medications   Medication Sig Start Date End Date Taking? Authorizing Provider  cetirizine (ZYRTEC) 10 MG tablet Take 10 mg by mouth daily.    [provider]  chlorhexidine (PERIDEX) 0.12 % solution Use as directed 15 mLs in the mouth or throat 2 (two) times daily. 05/18/20   Cathie Hoops, Amy V, PA-C  escitalopram (LEXAPRO) 10 MG tablet Take 10 mg by mouth daily. 05/30/21   [provider]  linaclotide Karlene Einstein) 290 MCG CAPS capsule Take by mouth. 09/04/19   [provider]  lubiprostone (AMITIZA) 24 MCG capsule Take 1 capsule by mouth daily. 09/04/19   [provider]  NURTEC 75 MG TBDP Take by mouth daily as needed. 05/09/21   [provider]   ondansetron (ZOFRAN-ODT) 4 MG disintegrating tablet Take by mouth. 05/05/21   [provider]  QUEtiapine (SEROQUEL) 100 MG tablet Take 100 mg by mouth at bedtime. 05/30/21   [provider]  topiramate (TOPAMAX) 25 MG tablet Start 1 hs x 1 week, increase weekly to 4 hs 05/05/21   [provider]  nortriptyline (PAMELOR) 25 MG capsule Take 25 mg by mouth at bedtime.  05/16/20  [provider]    Family History Family History  Problem Relation Age of Onset   Healthy Mother    Healthy Father     Social History Social History   Tobacco Use   Smoking status: Never    Passive exposure: Current   Smokeless tobacco: Never   Tobacco comments:    Dad smokes   Vaping Use   Vaping Use: Never used  Substance Use Topics   Alcohol use: Never   Drug use: Never     Allergies   Lactose intolerance (gi) and Diphenhydramine hcl   Review of Systems Review of Systems  Constitutional:  Negative for chills and fever.  HENT:  Negative for congestion, dental problem,  ear pain, sinus pressure, sore throat, trouble swallowing and voice change.   Respiratory:  Negative for cough and shortness of breath.   Cardiovascular:  Negative for chest pain and palpitations.  Skin:  Negative for color change, rash and wound.  All other systems reviewed and are negative.   Physical Exam Triage Vital Signs ED Triage Vitals  Enc Vitals Group     BP      Pulse      Resp      Temp      Temp src      SpO2      Weight      Height      Head Circumference      Peak Flow      Pain Score      Pain Loc      Pain Edu?      Excl. in [place]?    No data found.  Updated Vital Signs BP 129/84   Pulse 80   Temp 98.6 F (37 C)   Resp 18   LMP 06/20/2021   SpO2 98%   Visual Acuity Right Eye Distance:   Left Eye Distance:   Bilateral Distance:    Right Eye Near:   Left Eye Near:    Bilateral Near:     Physical Exam Vitals and nursing note reviewed.  Constitutional:       General: She is not in acute distress.    Appearance: She is well-developed. She is not ill-appearing.  HENT:     Head: Normocephalic and atraumatic.     Right Ear: Tympanic membrane normal.     Left Ear: Tympanic membrane normal.     Nose: Nose normal.     Mouth/Throat:     Mouth: Mucous membranes are moist.     Pharynx: Oropharynx is clear.  Eyes:     Conjunctiva/sclera: Conjunctivae normal.  Neck:     Comments: Tender, enlarged, mobile left submandibular lymph node. Cardiovascular:     Rate and Rhythm: Normal rate and regular rhythm.     Heart sounds: Normal heart sounds.  Pulmonary:     Effort: Pulmonary effort is normal. No respiratory distress.     Breath sounds: Normal breath sounds.  Abdominal:     Palpations: Abdomen is soft.     Tenderness: There is no abdominal tenderness.  Musculoskeletal:        General: No swelling, tenderness or deformity. Normal range of motion.     Cervical back: Normal range of motion and neck supple. No rigidity.  Lymphadenopathy:     Cervical: Cervical adenopathy present.  Skin:    General: Skin is warm and dry.     Findings: No bruising, erythema, lesion or rash.  Neurological:     General: No focal deficit present.     Mental Status: She is alert and oriented to person, place, and time.     Gait: Gait normal.  Psychiatric:        Mood and Affect: Mood normal.        Behavior: Behavior normal.     UC Treatments / Results  Labs (all labs ordered are listed, but only abnormal results are displayed) Labs Reviewed - No data to display  EKG   Radiology No results found.  Procedures Procedures (including critical care time)  Medications Ordered in UC Medications - No data to display  Initial Impression / Assessment and Plan / UC Course  I have reviewed the triage vital signs  and the nursing notes.  Pertinent labs & imaging results that were available during my care of the patient were reviewed by me and considered in my  medical decision making (see chart for details).  Left cervical lymphadenopathy.  Patient reports she feels well other than her tender swollen lymph node.  She has no other symptoms.  She is afebrile and her vital signs are stable.  She is well-appearing.  Treating symptomatically with Tylenol or ibuprofen; warm compresses.  Instructed patient to follow-up with her PCP next week if her symptoms are not improving.  Education provided on lymphadenopathy.  Patient agrees to plan of care.   Final Clinical Impressions(s) / UC Diagnoses   Final diagnoses:  Left cervical lymphadenopathy     Discharge Instructions      Take Tylenol or ibuprofen as needed for discomfort.  Apply warm compresses as directed.  Follow up with your primary care provider if your symptoms are not improving.         ED Prescriptions   None    PDMP not reviewed this encounter.   Mickie Bail, NP 07/14/21 1919

## 2021-07-14 NOTE — ED Triage Notes (Signed)
Pt presents with left jaw pain that began yesterday , states lymph nodes are swollen

## 2021-07-18 ENCOUNTER — Other Ambulatory Visit: Payer: Self-pay

## 2021-07-18 ENCOUNTER — Ambulatory Visit
Admission: EM | Admit: 2021-07-18 | Discharge: 2021-07-18 | Disposition: A | Discharge status: Home / Self Care | Payer: Medicaid Other | Attending: Family Medicine | Admitting: Family Medicine

## 2021-07-18 ENCOUNTER — Encounter: Payer: Self-pay | Admitting: Emergency Medicine

## 2021-07-18 DIAGNOSIS — R220 Localized swelling, mass and lump, head: Secondary | ICD-10-CM

## 2021-07-18 MED ORDER — MUPIROCIN 2 % EX OINT: 1.0000 "application " | TOPICAL_OINTMENT | Freq: Three times a day (TID) | CUTANEOUS | 0 refills | Status: AC

## 2021-07-18 MED ORDER — PREDNISONE 10 MG (21) PO TBPK: ORAL_TABLET | ORAL | 0 refills | Status: AC

## 2021-07-18 NOTE — Discharge Instructions (Signed)
Medication as prescribed.  If you worsen, please let us know.  Take care  Dr. Rufina Kimery  

## 2021-07-18 NOTE — ED Triage Notes (Signed)
Pt c/o lower lip swelling and tingling. Started this morning when she woke up. She also states she has a insect bite on her upper back that itches. She states she noticed it this morning also.

## 2021-07-18 NOTE — ED Provider Notes (Signed)
[facility]    CSN: [id number] Arrival date & time: 07/18/21  0827      History   Chief Complaint Chief Complaint  Patient presents with   Insect Bite   Oral Swelling   HPI  20 year old female presents with the above complaints.  Patient states she woke up early this morning and had swelling of her lower lip.  Unclear inciting factor.  She is not on any medications which could cause angioedema.  Patient states that she also has a suspected bug bite on her back below her bra line.  She is unsure if she got bitten by an insect.  She is concerned that this may have caused her lip swelling.  No fever.  No relieving factors.  No difficulties breathing.  No other complaints or concerns at this time.  Past Medical History:  Diagnosis Date   Migraine    Seasonal allergies     Patient Active Problem List   Diagnosis Date Noted   Preseptal cellulitis of right lower eyelid 02/28/2016    Past Surgical History:  Procedure Laterality Date   WISDOM TOOTH EXTRACTION      OB History     Gravida  0   Para  0   Term  0   Preterm  0   AB  0   Living  0      SAB  0   IAB  0   Ectopic  0   Multiple  0   Live Births  0            Home Medications    Prior to Admission medications   Medication Sig Start Date End Date Taking? Authorizing Provider  cetirizine (ZYRTEC) 10 MG tablet Take 10 mg by mouth daily.   Yes [provider]  linaclotide (LINZESS) 290 MCG CAPS capsule Take by mouth. 09/04/19  Yes [provider]  mupirocin ointment (BACTROBAN) 2 % Apply 1 application topically 3 (three) times daily for 7 days. 07/18/21 07/25/21 Yes Shirlee Whitmire G, DO  predniSONE (STERAPRED UNI-PAK 21 TAB) 10 MG (21) TBPK tablet 6 tablets on day 1; decrease by 1 tablet daily until gone. 07/18/21  Yes Om Lizotte G, DO  NURTEC 75 MG TBDP Take by mouth daily as needed. 05/09/21   [provider]  ondansetron (ZOFRAN-ODT) 4 MG disintegrating tablet  Take by mouth. 05/05/21   [provider]  nortriptyline (PAMELOR) 25 MG capsule Take 25 mg by mouth at bedtime.  05/16/20  [provider]    Family History Family History  Problem Relation Age of Onset   Healthy Mother    Healthy Father     Social History Social History   Tobacco Use   Smoking status: Never    Passive exposure: Current   Smokeless tobacco: Never   Tobacco comments:    Dad smokes   Vaping Use   Vaping Use: Never used  Substance Use Topics   Alcohol use: Never   Drug use: Never     Allergies   Lactose intolerance (gi) and Diphenhydramine hcl   Review of Systems Review of Systems Per HPI  Physical Exam Triage Vital Signs ED Triage Vitals  Enc Vitals Group     BP 07/18/21 0905 121/71     Pulse Rate 07/18/21 0905 63     Resp 07/18/21 0905 18     Temp 07/18/21 0905 98.2 F (36.8 C)     Temp Source 07/18/21 0905 Oral  SpO2 07/18/21 0905 100 %     Weight 07/18/21 0903 248 lb 0.3 oz (112.5 kg)     Height 07/18/21 0903 5\' 11"  (1.803 m)     Head Circumference --      Peak Flow --      Pain Score 07/18/21 0902 6     Pain Loc --      Pain Edu? --      Excl. in GC? --    No data found.  Updated Vital Signs BP 121/71 (BP Location: Right Arm)   Pulse 63   Temp 98.2 F (36.8 C) (Oral)   Resp 18   Ht 5\' 11"  (1.803 m)   Wt 112.5 kg   LMP 06/20/2021 (Approximate)   SpO2 100%   BMI 34.59 kg/m   Visual Acuity Right Eye Distance:   Left Eye Distance:   Bilateral Distance:    Right Eye Near:   Left Eye Near:    Bilateral Near:     Physical Exam Vitals and nursing note reviewed.  Constitutional:      General: She is not in acute distress.    Appearance: Normal appearance. She is not ill-appearing.  HENT:     Head: Normocephalic and atraumatic.     Mouth/Throat:     Comments: Swelling of the lower lip.  No fluctuance.   Pulmonary:     Effort: Pulmonary effort is normal. No respiratory distress.  Skin:    Comments:  Midline of the lower thoracic spine below the bra line with a small pustule with mild surrounding erythema  Neurological:     Mental Status: She is alert.  Psychiatric:        Mood and Affect: Mood normal.        Behavior: Behavior normal.     UC Treatments / Results  Labs (all labs ordered are listed, but only abnormal results are displayed) Labs Reviewed - No data to display  EKG   Radiology No results found.  Procedures Procedures (including critical care time)  Medications Ordered in UC Medications - No data to display  Initial Impression / Assessment and Plan / UC Course  I have reviewed the triage vital signs and the nursing notes.  Pertinent labs & imaging results that were available during my care of the patient were reviewed by me and considered in my medical decision making (see chart for details).    20 year old female presents with lip swelling.  Patient also has a small pustule on her back.  Treating with prednisone and Bactroban ointment.  Final Clinical Impressions(s) / UC Diagnoses   Final diagnoses:  Lip swelling     Discharge Instructions      Medication as prescribed.  If you worsen, please let 06/22/2021 know.  Take care  Dr. 36    ED Prescriptions     Medication Sig Dispense Auth. Provider   predniSONE (STERAPRED UNI-PAK 21 TAB) 10 MG (21) TBPK tablet 6 tablets on day 1; decrease by 1 tablet daily until gone. 21 tablet Shaterica Mcclatchy G, DO   mupirocin ointment (BACTROBAN) 2 % Apply 1 application topically 3 (three) times daily for 7 days. 30 g Adriana Simas, DO      PDMP not reviewed this encounter.   12-10-2003, DO 07/18/21 1004

## 2021-12-05 ENCOUNTER — Ambulatory Visit: Payer: Self-pay

## 2022-03-11 ENCOUNTER — Encounter: Payer: Self-pay | Admitting: Emergency Medicine

## 2022-03-11 ENCOUNTER — Ambulatory Visit
Admission: EM | Admit: 2022-03-11 | Discharge: 2022-03-11 | Disposition: A | Discharge status: Home / Self Care | Payer: BC Managed Care – PPO | Attending: Emergency Medicine | Admitting: Emergency Medicine

## 2022-03-11 DIAGNOSIS — Z3A15 15 weeks gestation of pregnancy: Secondary | ICD-10-CM

## 2022-03-11 DIAGNOSIS — M546 Pain in thoracic spine: Secondary | ICD-10-CM

## 2022-03-11 DIAGNOSIS — O99891 Other specified diseases and conditions complicating pregnancy: Secondary | ICD-10-CM

## 2022-03-11 LAB — POCT URINALYSIS DIP (MANUAL ENTRY)
Bilirubin, UA: NEGATIVE
Blood, UA: NEGATIVE
Glucose, UA: NEGATIVE mg/dL
Ketones, POC UA: NEGATIVE mg/dL
Nitrite, UA: NEGATIVE
Protein Ur, POC: NEGATIVE mg/dL
Spec Grav, UA: 1.02 (ref 1.010–1.025)
Urobilinogen, UA: 0.2 E.U./dL
pH, UA: 6.5 (ref 5.0–8.0)

## 2022-03-11 NOTE — Discharge Instructions (Addendum)
Take Tylenol as needed for discomfort.  Go to the emergency department if you have shortness of breath, abdominal pain, or other concerning symptoms.  Follow-up with your OB/GYN on Monday. ?

## 2022-03-11 NOTE — ED Provider Notes (Signed)
?[facility] ? ? ? ?CSN: [id number] ?Arrival date & time: 03/11/22  0813 ? ? ?  ? ?History   ?Chief Complaint ?Chief Complaint  ?Patient presents with  ? Back Pain  ? ? ?HPI ?Meagan Daniel is a 21 y.o. female.  Patient is [redacted] weeks pregnant.  She presents with left mid back pain since she tripped and fell on 03/09/2022.  She was at work at the time of the fall but was "off the clock" and states it was unrelated to work; "It was my fault." The pain only occurs when she coughs.  No bruising, redness, wounds, shortness of breath, chest pain, abdominal pain, vaginal bleeding, dysuria, hematuria, or other concerns.  No treatments at home.   ? ?The history is provided by the patient and medical records.  ? ?Past Medical History:  ?Diagnosis Date  ? Migraine   ? Seasonal allergies   ? ? ?Patient Active Problem List  ? Diagnosis Date Noted  ? Preseptal cellulitis of right lower eyelid 02/28/2016  ? ? ?Past Surgical History:  ?Procedure Laterality Date  ? WISDOM TOOTH EXTRACTION    ? ? ?OB History   ? ? Gravida  ?1  ? Para  ?0  ? Term  ?0  ? Preterm  ?0  ? AB  ?0  ? Living  ?0  ?  ? ? SAB  ?0  ? IAB  ?0  ? Ectopic  ?0  ? Multiple  ?0  ? Live Births  ?0  ?   ?  ?  ? ? ? ?Home Medications   ? ?Prior to Admission medications   ?Medication Sig Start Date End Date Taking? Authorizing Provider  ?cetirizine (ZYRTEC) 10 MG tablet Take 10 mg by mouth daily.    [provider]  ?linaclotide Rolan Lipa) 290 MCG CAPS capsule Take by mouth. 09/04/19   [provider]  ?NURTEC 75 MG TBDP Take by mouth daily as needed. 05/09/21   [provider]  ?ondansetron (ZOFRAN-ODT) 4 MG disintegrating tablet Take by mouth. 05/05/21   [provider]  ?predniSONE (STERAPRED UNI-PAK 21 TAB) 10 MG (21) TBPK tablet 6 tablets on day 1; decrease by 1 tablet daily until gone. 07/18/21   Coral Spikes, DO  ?nortriptyline (PAMELOR) 25 MG capsule Take 25 mg by mouth at bedtime.  05/16/20  [provider]  ? ? ?Family  History ?Family History  ?Problem Relation Age of Onset  ? Healthy Mother   ? Healthy Father   ? ? ?Social History ?Social History  ? ?Tobacco Use  ? Smoking status: Never  ?  Passive exposure: Current  ? Smokeless tobacco: Never  ? Tobacco comments:  ?  Dad smokes   ?Vaping Use  ? Vaping Use: Never used  ?Substance Use Topics  ? Alcohol use: Never  ? Drug use: Never  ? ? ? ?Allergies   ?Lactose intolerance (gi) and Diphenhydramine hcl ? ? ?Review of Systems ?Review of Systems  ?Constitutional:  Negative for chills and fever.  ?Respiratory:  Negative for cough and shortness of breath.   ?Cardiovascular:  Negative for chest pain and palpitations.  ?Gastrointestinal:  Negative for abdominal pain, nausea and vomiting.  ?Genitourinary:  Negative for dysuria and hematuria.  ?Musculoskeletal:  Positive for back pain. Negative for gait problem.  ?Skin:  Negative for color change, rash and wound.  ?All other systems reviewed and are negative. ? ? ?Physical Exam ?Triage Vital Signs ?ED Triage Vitals  ?Enc Vitals Group  ?  BP   ?   Pulse   ?   Resp   ?   Temp   ?   Temp src   ?   SpO2   ?   Weight   ?   Height   ?   Head Circumference   ?   Peak Flow   ?   Pain Score   ?   Pain Loc   ?   Pain Edu?   ?   Excl. in [place]?   ? ?No data found. ? ?Updated Vital Signs ?BP 123/82   Pulse 98   Temp 98.4 ?F (36.9 ?C)   Resp 18   LMP 06/20/2021 (Approximate)   SpO2 98%  ? ?Visual Acuity ?Right Eye Distance:   ?Left Eye Distance:   ?Bilateral Distance:   ? ?Right Eye Near:   ?Left Eye Near:    ?Bilateral Near:    ? ?Physical Exam ?Vitals and nursing note reviewed.  ?Constitutional:   ?   General: She is not in acute distress. ?   Appearance: Normal appearance. She is well-developed. She is not ill-appearing.  ?HENT:  ?   Mouth/Throat:  ?   Mouth: Mucous membranes are moist.  ?Cardiovascular:  ?   Rate and Rhythm: Normal rate and regular rhythm.  ?   Heart sounds: Normal heart sounds.  ?Pulmonary:  ?   Effort: Pulmonary effort is  normal. No respiratory distress.  ?   Breath sounds: Normal breath sounds.  ?   Comments: Full deep breaths without difficulty. ?Abdominal:  ?   Palpations: Abdomen is soft.  ?   Tenderness: There is no abdominal tenderness.  ?Musculoskeletal:     ?   General: Tenderness present. No swelling or deformity. Normal range of motion.  ?     Arms: ? ?   Cervical back: Neck supple.  ?Skin: ?   General: Skin is warm and dry.  ?   Capillary Refill: Capillary refill takes less than 2 seconds.  ?   Findings: No bruising, erythema, lesion or rash.  ?Neurological:  ?   General: No focal deficit present.  ?   Mental Status: She is alert and oriented to person, place, and time.  ?   Sensory: No sensory deficit.  ?   Motor: No weakness.  ?   Gait: Gait normal.  ?Psychiatric:     ?   Mood and Affect: Mood normal.     ?   Behavior: Behavior normal.  ? ? ? ?UC Treatments / Results  ?Labs ?(all labs ordered are listed, but only abnormal results are displayed) ?Labs Reviewed  ?POCT URINALYSIS DIP (MANUAL ENTRY) - Abnormal; Notable for the following components:  ?    Result Value  ? Clarity, UA cloudy (*)   ? Leukocytes, UA Trace (*)   ? All other components within normal limits  ? ? ?EKG ? ? ?Radiology ?No results found. ? ?Procedures ?Procedures (including critical care time) ? ?Medications Ordered in UC ?Medications - No data to display ? ?Initial Impression / Assessment and Plan / UC Course  ?I have reviewed the triage vital signs and the nursing notes. ? ?Pertinent labs & imaging results that were available during my care of the patient were reviewed by me and considered in my medical decision making (see chart for details). ? ?Left posterior back pain due to fall.  [redacted] weeks pregnant.  Patient reports no concerns with her pregnancy and declines transfer to the ED.  She is mildly tender over her over her left lateral posterior back.  Discussed symptomatic treatment with Tylenol and rest.  ED precautions discussed.  Instructed patient  to follow-up with her OB/GYN tomorrow.  She agrees to plan of care. ? ? ?Final Clinical Impressions(s) / UC Diagnoses  ? ?Final diagnoses:  ?[redacted] weeks gestation of pregnancy  ?Acute left-sided thoracic back pain  ? ? ? ?Discharge Instructions   ? ?  ?Take Tylenol as needed for discomfort.  Go to the emergency department if you have shortness of breath, abdominal pain, or other concerning symptoms.  Follow-up with your OB/GYN on Monday. ? ? ? ? ?ED Prescriptions   ?None ?  ? ?PDMP not reviewed this encounter. ?  ?Sharion Balloon, NP ?03/11/22 [phone] ? ?

## 2022-03-11 NOTE — ED Triage Notes (Signed)
Pt tripped over a sleep cot at work and fell on her back 3 days ago. She has a pain in her lower back that only hurts when she coughs. Pt is [redacted] weeks pregnant.  ?

## 2023-09-17 ENCOUNTER — Ambulatory Visit: Payer: BC Managed Care – PPO

## 2024-09-15 ENCOUNTER — Emergency Department

## 2024-09-15 ENCOUNTER — Other Ambulatory Visit: Payer: Self-pay

## 2024-09-15 DIAGNOSIS — M25511 Pain in right shoulder: Secondary | ICD-10-CM | POA: Diagnosis present

## 2024-09-15 DIAGNOSIS — M25561 Pain in right knee: Secondary | ICD-10-CM | POA: Diagnosis not present

## 2024-09-15 DIAGNOSIS — Y92410 Unspecified street and highway as the place of occurrence of the external cause: Secondary | ICD-10-CM | POA: Insufficient documentation
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
## 2024-09-16 ENCOUNTER — Emergency Department
Admission: EM | Admit: 2024-09-16 | Discharge: 2024-09-16 | Disposition: A | Discharge status: Home / Self Care | Attending: Emergency Medicine | Admitting: Emergency Medicine

## 2024-09-16 DIAGNOSIS — M25561 Pain in right knee: Secondary | ICD-10-CM

## 2024-09-16 DIAGNOSIS — M25511 Pain in right shoulder: Secondary | ICD-10-CM
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
## 2024-09-19 ENCOUNTER — Other Ambulatory Visit: Payer: Self-pay

## 2024-09-19 ENCOUNTER — Emergency Department

## 2024-09-19 ENCOUNTER — Emergency Department
Admission: EM | Admit: 2024-09-19 | Discharge: 2024-09-19 | Disposition: A | Discharge status: Home / Self Care | Attending: Emergency Medicine | Admitting: Emergency Medicine

## 2024-09-19 ENCOUNTER — Encounter: Payer: Self-pay | Admitting: Intensive Care

## 2024-09-19 DIAGNOSIS — R519 Headache, unspecified: Secondary | ICD-10-CM | POA: Insufficient documentation

## 2024-09-19 DIAGNOSIS — Y92410 Unspecified street and highway as the place of occurrence of the external cause: Secondary | ICD-10-CM | POA: Diagnosis not present

## 2024-09-19 MED ORDER — SODIUM CHLORIDE 0.9 % IV BOLUS
1000.0000 mL | Freq: Once | INTRAVENOUS | Status: AC
  Administered 2024-09-19: 1000 mL via INTRAVENOUS

## 2024-09-19 MED ORDER — DEXAMETHASONE SOD PHOSPHATE PF 10 MG/ML IJ SOLN
10.0000 mg | Freq: Once | INTRAMUSCULAR | Status: AC
  Administered 2024-09-19: 10 mg via INTRAVENOUS

## 2024-09-19 MED ORDER — KETOROLAC TROMETHAMINE 15 MG/ML IJ SOLN
15.0000 mg | Freq: Once | INTRAMUSCULAR | Status: AC
  Administered 2024-09-19: 15 mg via INTRAVENOUS
  Filled 2024-09-19: qty 1

## 2024-09-19 MED ORDER — ONDANSETRON HCL 4 MG/2ML IJ SOLN
4.0000 mg | Freq: Once | INTRAMUSCULAR | Status: AC
  Administered 2024-09-19: 4 mg via INTRAVENOUS
  Filled 2024-09-19: qty 2

## 2024-09-19 NOTE — Discharge Instructions (Signed)
 You were evaluated in the ED for a headache and/or head injury. Your evaluation did not reveal signs of a serious condition such as a brain bleed or skull fracture and were reassuring.  Your CT head is normal.  Consider these rest and recovery strategies at home:  - Avoid strenous activity , exercise or heavy lifting for a few days.  -Limit screen time and activities requiring intense focus.  - Gradually return to normal activities as symptoms improve   Take tylenol  as needed.   Pain control:  Ibuprofen  (motrin /aleve /advil ) - You can take 3 tablets (600 mg) every 6 hours as needed for pain.  Acetaminophen  (tylenol ) - You can take 2 extra strength tablets (1000 mg) every 6 hours as needed for pain.  You can alternate these medications or take them together.  Make sure you eat food/drink water when taking these medications.   Apply ice/cold pack to the affected area 10-20 minutes every 2-3 hours for the first 24-48 hours to reduce pain and swelling. Avoid alcohol and caffeine.   Follow up with you PCP in 1-2 weeks if symptoms persist or worsen.
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
## 2024-09-19 NOTE — ED Notes (Signed)
 Pt discharged to home, AVS and medications reviewed.  Pt verbalized understanding, has no questions at this time.
